# Patient Record
Sex: Female | Born: 1975 | Race: White | Hispanic: No | Marital: Married | Smoking: Former smoker
Health system: Southern US, Community
[De-identification: ages and names within clinical notes are randomized; demographics above are authoritative.]

## PROBLEM LIST (undated history)

## (undated) DIAGNOSIS — IMO0002 Reserved for concepts with insufficient information to code with codable children: Secondary | ICD-10-CM

## (undated) DIAGNOSIS — Z9889 Other specified postprocedural states: Secondary | ICD-10-CM

## (undated) DIAGNOSIS — Z9882 Breast implant status: Secondary | ICD-10-CM

## (undated) DIAGNOSIS — C539 Malignant neoplasm of cervix uteri, unspecified: Secondary | ICD-10-CM

## (undated) DIAGNOSIS — R87619 Unspecified abnormal cytological findings in specimens from cervix uteri: Secondary | ICD-10-CM

## (undated) HISTORY — PX: CERVICAL CONE BIOPSY: SUR198

## (undated) HISTORY — PX: ENDOMETRIAL ABLATION: SHX621

## (undated) HISTORY — DX: Reserved for concepts with insufficient information to code with codable children: IMO0002

## (undated) HISTORY — DX: Other specified postprocedural states: Z98.890

## (undated) HISTORY — DX: Unspecified abnormal cytological findings in specimens from cervix uteri: R87.619

## (undated) HISTORY — PX: AUGMENTATION MAMMAPLASTY: SUR837

## (undated) HISTORY — PX: CERVICAL BIOPSY  W/ LOOP ELECTRODE EXCISION: SUR135

## (undated) HISTORY — PX: TUBAL LIGATION: SHX77

---

## 2006-04-10 ENCOUNTER — Inpatient Hospital Stay: Payer: Self-pay | Admitting: Unknown Physician Specialty

## 2006-06-22 ENCOUNTER — Ambulatory Visit: Payer: Self-pay | Admitting: Orthopedic Surgery

## 2011-09-07 ENCOUNTER — Ambulatory Visit: Payer: Self-pay | Admitting: Gynecologic Oncology

## 2011-09-07 ENCOUNTER — Ambulatory Visit: Payer: Self-pay

## 2011-09-21 ENCOUNTER — Ambulatory Visit: Payer: Self-pay | Admitting: Gynecologic Oncology

## 2011-09-27 LAB — PATHOLOGY REPORT

## 2011-10-12 ENCOUNTER — Ambulatory Visit: Payer: Self-pay | Admitting: Gynecologic Oncology

## 2011-10-12 LAB — CBC
HCT: 38.1 % (ref 35.0–47.0)
HGB: 12.8 g/dL (ref 12.0–16.0)
MCH: 27.3 pg (ref 26.0–34.0)
MCHC: 33.6 g/dL (ref 32.0–36.0)
MCV: 81 fL (ref 80–100)
Platelet: 240 10*3/uL (ref 150–440)
RBC: 4.69 10*6/uL (ref 3.80–5.20)
RDW: 13.5 % (ref 11.5–14.5)
WBC: 9 10*3/uL (ref 3.6–11.0)

## 2011-10-12 LAB — BASIC METABOLIC PANEL
Anion Gap: 7 (ref 7–16)
BUN: 11 mg/dL (ref 7–18)
Calcium, Total: 8.7 mg/dL (ref 8.5–10.1)
Chloride: 107 mmol/L (ref 98–107)
Co2: 27 mmol/L (ref 21–32)
Creatinine: 0.78 mg/dL (ref 0.60–1.30)
EGFR (African American): >60
EGFR (Non-African Amer.): >60
Glucose: 100 mg/dL — ABNORMAL HIGH (ref 65–99)
Osmolality: 281 (ref 275–301)
Potassium: 3.9 mmol/L (ref 3.5–5.1)
Sodium: 141 mmol/L (ref 136–145)

## 2011-10-12 LAB — PREGNANCY, URINE: Pregnancy Test, Urine: NEGATIVE m[IU]/mL

## 2011-10-19 ENCOUNTER — Ambulatory Visit: Payer: Self-pay | Admitting: Gynecologic Oncology

## 2011-10-19 LAB — PREGNANCY, URINE: Pregnancy Test, Urine: NEGATIVE m[IU]/mL

## 2011-10-21 ENCOUNTER — Ambulatory Visit: Payer: Self-pay | Admitting: Gynecologic Oncology

## 2011-10-25 LAB — PATHOLOGY REPORT

## 2011-11-21 ENCOUNTER — Ambulatory Visit: Payer: Self-pay | Admitting: Gynecologic Oncology

## 2011-12-21 ENCOUNTER — Ambulatory Visit: Payer: Self-pay | Admitting: Gynecologic Oncology

## 2012-01-21 ENCOUNTER — Ambulatory Visit: Payer: Self-pay | Admitting: Gynecologic Oncology

## 2012-05-16 ENCOUNTER — Encounter: Payer: Self-pay | Admitting: Internal Medicine

## 2012-05-16 ENCOUNTER — Ambulatory Visit (INDEPENDENT_AMBULATORY_CARE_PROVIDER_SITE_OTHER): Payer: 59 | Admitting: Internal Medicine

## 2012-05-16 VITALS — BP 120/80 | HR 91 | Temp 98.1°F | Ht 62.5 in | Wt 142.0 lb

## 2012-05-16 DIAGNOSIS — R21 Rash and other nonspecific skin eruption: Secondary | ICD-10-CM

## 2012-05-28 ENCOUNTER — Encounter: Payer: Self-pay | Admitting: Internal Medicine

## 2012-05-28 NOTE — Progress Notes (Signed)
  Subjective:    Patient ID: Danielle Marshall, female    DOB: 26-Nov-1975, 37 y.o.   MRN: [id number]  HPI 37 year old female who presents as an acute visit with a rash.  She is planning a root canal in the near future.  Was placed on clindamycin.  This is her third week of clindamycin.  She noticed this am some itching - starting on her head.  Now has a diffuse rash.  Increased itching.  No other new meds or exposures.  No sore throat.  No tongue swelling.  Throat may feel a little irritated.  No trouble breathing.    Past Medical History  Diagnosis Date  . Abnormal Pap smear     s/p LEEP     Review of Systems Patient denies any headache, lightheadedness or dizziness.  No fever.  No significant chest tightness.  No increased shortness of breath or trouble breathing.  No nausea or vomiting.   Has an IUD.        Objective:   Physical Exam Filed Vitals:   05/16/12 0813  BP: 120/80  Pulse: 91  Temp: 98.1 F (81.44 C)   37 year old female in no acute distress.   HEENT:  Nares- clear.  Oropharynx - without lesions.  No tongue swelling.   NECK:  Supple.  Nontender.    HEART:  Appears to be regular. LUNGS:  No crackles or wheezing audible.  Respirations even and unlabored.  RADIAL PULSE:  Equal bilaterally.    SKIN:  Diffuse erythematous based rash that appears to be consistent with a drug reaction.            Assessment & Plan:  RASH.  Appears to be consistent with a drug reaction.  Stop clindamycin and notify her dentist.  Benadryl and Zyrtec as directed.  Medrol dose pack - 6 day taper.  Follow.

## 2012-06-21 ENCOUNTER — Ambulatory Visit: Payer: Self-pay

## 2012-06-21 ENCOUNTER — Ambulatory Visit: Payer: Self-pay | Admitting: Gynecologic Oncology

## 2012-07-18 ENCOUNTER — Encounter: Payer: Self-pay | Admitting: Internal Medicine

## 2012-07-20 ENCOUNTER — Ambulatory Visit: Payer: Self-pay | Admitting: Gynecologic Oncology

## 2012-07-30 ENCOUNTER — Emergency Department (HOSPITAL_COMMUNITY)
Admission: EM | Admit: 2012-07-30 | Discharge: 2012-07-31 | Disposition: A | Discharge status: Home / Self Care | Payer: 59 | Attending: Emergency Medicine | Admitting: Emergency Medicine

## 2012-07-30 DIAGNOSIS — R110 Nausea: Secondary | ICD-10-CM | POA: Insufficient documentation

## 2012-07-30 DIAGNOSIS — Z978 Presence of other specified devices: Secondary | ICD-10-CM | POA: Insufficient documentation

## 2012-07-30 DIAGNOSIS — S060X9A Concussion with loss of consciousness of unspecified duration, initial encounter: Secondary | ICD-10-CM | POA: Insufficient documentation

## 2012-07-30 DIAGNOSIS — R6889 Other general symptoms and signs: Secondary | ICD-10-CM

## 2012-07-30 DIAGNOSIS — Z87891 Personal history of nicotine dependence: Secondary | ICD-10-CM | POA: Insufficient documentation

## 2012-07-30 DIAGNOSIS — H539 Unspecified visual disturbance: Secondary | ICD-10-CM | POA: Insufficient documentation

## 2012-07-30 DIAGNOSIS — Z8541 Personal history of malignant neoplasm of cervix uteri: Secondary | ICD-10-CM | POA: Insufficient documentation

## 2012-07-30 HISTORY — DX: Breast implant status: Z98.82

## 2012-07-30 HISTORY — DX: Malignant neoplasm of cervix uteri, unspecified: C53.9

## 2012-07-31 ENCOUNTER — Emergency Department (HOSPITAL_COMMUNITY): Payer: 59

## 2012-07-31 ENCOUNTER — Encounter (HOSPITAL_COMMUNITY): Payer: Self-pay | Admitting: *Deleted

## 2012-07-31 MED ORDER — HYDROCODONE-ACETAMINOPHEN 5-325 MG PO TABS: 1.0000 | ORAL_TABLET | Freq: Four times a day (QID) | ORAL | Status: DC | PRN

## 2012-07-31 MED ORDER — ACETAMINOPHEN 325 MG PO TABS
650.0000 mg | ORAL_TABLET | Freq: Once | ORAL | Status: AC
  Administered 2012-07-31: 650 mg via ORAL
  Filled 2012-07-31: qty 2

## 2012-07-31 NOTE — ED Provider Notes (Signed)
Medical screening examination/treatment/procedure(s) were performed by non-physician practitioner and as supervising physician I was immediately available for consultation/collaboration.   Dione Booze, MD 07/31/12 [phone]
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
## 2012-08-16 LAB — PATHOLOGY REPORT

## 2012-08-20 ENCOUNTER — Ambulatory Visit: Payer: Self-pay | Admitting: Gynecologic Oncology

## 2012-09-19 ENCOUNTER — Ambulatory Visit: Payer: Self-pay | Admitting: Gynecologic Oncology

## 2012-11-01 ENCOUNTER — Ambulatory Visit: Payer: Self-pay | Admitting: Obstetrics and Gynecology

## 2012-11-01 LAB — BASIC METABOLIC PANEL
Anion Gap: 2 — ABNORMAL LOW (ref 7–16)
BUN: 9 mg/dL (ref 7–18)
Calcium, Total: 9 mg/dL (ref 8.5–10.1)
Chloride: 106 mmol/L (ref 98–107)
Co2: 30 mmol/L (ref 21–32)
Creatinine: 0.68 mg/dL (ref 0.60–1.30)
EGFR (African American): >60
EGFR (Non-African Amer.): >60
Glucose: 93 mg/dL (ref 65–99)
Osmolality: 274 (ref 275–301)
Potassium: 4.6 mmol/L (ref 3.5–5.1)
Sodium: 138 mmol/L (ref 136–145)

## 2012-11-01 LAB — PREGNANCY, URINE: Pregnancy Test, Urine: NEGATIVE m[IU]/mL

## 2012-11-01 LAB — CBC
HCT: 39 % (ref 35.0–47.0)
HGB: 13.3 g/dL (ref 12.0–16.0)
MCH: 27.2 pg (ref 26.0–34.0)
MCHC: 34.2 g/dL (ref 32.0–36.0)
MCV: 80 fL (ref 80–100)
Platelet: 267 10*3/uL (ref 150–440)
RBC: 4.9 10*6/uL (ref 3.80–5.20)
RDW: 13.7 % (ref 11.5–14.5)
WBC: 11.2 10*3/uL — ABNORMAL HIGH (ref 3.6–11.0)

## 2012-11-07 ENCOUNTER — Ambulatory Visit: Payer: Self-pay | Admitting: Obstetrics and Gynecology

## 2012-11-09 LAB — PATHOLOGY REPORT

## 2014-01-03 DIAGNOSIS — K581 Irritable bowel syndrome with constipation: Secondary | ICD-10-CM | POA: Insufficient documentation

## 2014-01-03 DIAGNOSIS — R1012 Left upper quadrant pain: Secondary | ICD-10-CM

## 2014-01-03 DIAGNOSIS — G8929 Other chronic pain: Secondary | ICD-10-CM | POA: Insufficient documentation

## 2014-01-16 ENCOUNTER — Ambulatory Visit: Payer: Self-pay | Admitting: Unknown Physician Specialty

## 2014-03-22 DIAGNOSIS — Z9889 Other specified postprocedural states: Secondary | ICD-10-CM

## 2014-03-22 HISTORY — DX: Other specified postprocedural states: Z98.890

## 2014-07-09 NOTE — Op Note (Signed)
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   PROCEDURE: LEEP.   SURGEON: Weber Cooks, MD    ANESTHESIA: General.   COMPLICATIONS: None.  ESTIMATED BLOOD LOSS: Minimal.   INDICATION FOR SURGERY: Ms. Bensman is a 39 year old patient who presented with high-grade SIL on Pap smear and on colposcopically directed biopsy and was noted to have moderate dysplasia with a negative ECC. Therefore, decision was made to proceed with an LEEP.   FINDINGS AT TIME OF SURGERY: Normal external genitalia, urethral meatus, urethra, bladder, and vagina. Cervix with a small central iodine-negative area. IUD thread in-situ. No vaginal lesions seen. Remainder of the pelvic exam was unremarkable.   OPERATIVE REPORT: After adequate general anesthesia had been obtained, the patient was prepped and draped in high lithotomy position. Lugol stain was done. The procedure was more difficult due to the fact that the IUD thread had to be preserved. The cervix was resected in four portions. Hemostasis was noted to be adequate at the end of the procedure. Monsel solution was applied. The patient tolerated the procedure well and was taken to the recovery room in satisfactory condition. Postoperative urine was clear. Pad, sponge, needle, and instrument counts were correct x2.   ____________________________ Weber Cooks, MD bem:drc D: 10/19/2011 14:52:58 ET T: 10/19/2011 15:37:12 ET JOB#: [id number] [name] MD ELECTRONICALLY SIGNED 10/26/2011 9:10

## 2014-07-12 NOTE — Op Note (Signed)
PATIENT NAME:  Danielle Marshall, Danielle Marshall MR#:  [id number] DATE OF BIRTH:  1975-12-26  DATE OF PROCEDURE:  11/07/2012  PREOPERATIVE DIAGNOSES: Cervical intraepithelial neoplasia-3;  multiparous female desiring permanent sterilization; menorrhagia, unresponsive to medical management.   POSTOPERATIVE DIAGNOSES: Cervical intraepithelial neoplasia-3;  multiparous female desiring permanent sterilization; menorrhagia, unresponsive to medical management.   PROCEDURE PERFORMED: Laparoscopic Falope-Ring bilateral tubal ligation; hysteroscopy;   dilatation and curettage; NovaSure endometrial ablation; and cold knife conization with  placement of sutures.   SURGEON: Delsa Sale, M.D.   ESTIMATED BLOOD LOSS: 250 mL.   FINDINGS: Bilateral butt sign after Falope-Ring applications. Good cauterization of the lining of the uterus from endometrial ablation, and Lugol's white mass on the cervix where the patient had abnormal biopsies with bleeding that was stopped by cervical artery sutures, cautery and Sturmdorf-type sutures.   DESCRIPTION OF PROCEDURE: The patient was taken to the operating room and placed in supine position. After adequate general endotracheal anesthesia was instilled, the patient was prepped and draped in the usual sterile fashion. The umbilicus was injected with Marcaine. The side-opening speculum was placed in the patient's vagina. The anterior lip of the cervix was grasped with a single-tooth tenaculum and the Hulka tenaculum was placed. The patient was placed in Trendelenburg.   Incision was made at the umbilicus. This was carried sharply down to the fascia. Veress needle was placed. Hang drop test, fluid inflation, fluid aspiration test showed proper placement of the Veress needle. CO2 was placed on low flow. When tympany was heard, CO2 was placed on high flow. The trocars placed in the umbilicus. The camera was placed under direct visualization, and 8 mm trocar port was placed approximately 2 cm  above the pubic symphysis under direct visualization. The tubes were doused in Marcaine, and the Falope-Ring applicator was placed into the abdomen. First the right tube was grasped and the Falope-Ring applicator was activated, then the applicator was reloaded, and the applicator was used to grasp the tube and place the Falope-Ring. Good blanching was seen bilaterally. Good butt sign was seen bilaterally. Photographs were taken. The trocars were removed. The umbilicus was sewn with the deep UR-6 suture and the skin was closed with 4-0 Monocryl and Dermabond. Band-Aids were placed.   Attention was then turned to the vagina where the Hulka tenaculum was removed from the cervix. A side-opening speculum was placed back into the vagina and the anterior lip of the cervix was grasped with a single-tooth tenaculum. The uterus was sounded. It sounded at 7 cm. The cervix sounded at 3. The fan width was 3.1. The cautery was performed with the NovaSure ablation, and the ablation occurred for 2 minutes.   Attention was then turned to the tenaculum which was removed. Good hemostasis was identified. The hysteroscope showed proper placement and proper burning. The tenaculum was removed from the patient's cervix and 2 sutures were placed, one at 3 and one at 9, around the cervical arteries. These were used also for manipulation. Lugol's was placed on the cervix. The aforementioned findings were seen. Cold knife was then used to cut a cone which was then excised with the Metzenbaum scissors. The patient was found to be hemostatic after 2 sutures at the base of the cone and placement of Monsel's .  The patient was then laid supine and taken to recovery after having tolerated the procedure well.    ____________________________ Delsa Sale, MD cck:np D: 11/09/2012 22:35:00 ET T: 11/09/2012 23:16:49 ET JOB#: [id number]  cc: Morey Hummingbird  Clayburn Pert, MD, <Dictator> Delsa Sale MD ELECTRONICALLY SIGNED 11/16/2012 18:47

## 2015-05-28 LAB — HM PAP SMEAR: HM Pap smear: NEGATIVE

## 2017-01-28 ENCOUNTER — Ambulatory Visit (INDEPENDENT_AMBULATORY_CARE_PROVIDER_SITE_OTHER): Payer: 59 | Admitting: Obstetrics and Gynecology

## 2017-01-28 ENCOUNTER — Encounter: Payer: Self-pay | Admitting: Obstetrics and Gynecology

## 2017-01-28 VITALS — BP 118/74 | Ht 62.0 in | Wt 130.0 lb

## 2017-01-28 DIAGNOSIS — Z01419 Encounter for gynecological examination (general) (routine) without abnormal findings: Secondary | ICD-10-CM | POA: Diagnosis not present

## 2017-01-28 DIAGNOSIS — Z124 Encounter for screening for malignant neoplasm of cervix: Secondary | ICD-10-CM

## 2017-01-28 DIAGNOSIS — Z1339 Encounter for screening examination for other mental health and behavioral disorders: Secondary | ICD-10-CM | POA: Diagnosis not present

## 2017-01-28 NOTE — Progress Notes (Signed)
Gynecology Annual Exam  PCP: Danielle Pheasant, MD  Chief Complaint  Patient presents with  . Annual Exam    History of Present Illness:  [name] is a 41 y.o. [id number] who is amenorrheic due to endometrial ablation.   She is single partner, contraception - tubal ligation.  Last Pap: 05/2015  Results were: no abnormalities /neg HPV DNA negative (Significant history of abnormal pap smears and is status post LEEP and CKC). Hx of STDs: HPV  Last mammogram: n/a  There is no FH of breast cancer. There is no FH of ovarian cancer. The patient does not do self-breast exams.  Tobacco use: former smoker. Alcohol use: social drinker Exercise: moderately active  The patient wears seatbelts: yes.      Cervical Dysplasia History: 7/13: CIN 2 on colposcopy -> LEEP --> CIN 3 w/ positive margins 06/2012 - Pap --> HGSIL 10/2012 - CKC --> CIN 3, + endocervical margin 2/15 - Pap --> NIL, HPV NEGATIVE, ECC NEG 6/15 - pap --> NIL (no endocervical component), HPV NEGATIVE 2/16 - pap --> NIL, HPV NEGATIVE 9/16 - pap --> ASCUS, HPV negative, ECC negative 05/2015 - pap --> NILM, HPV neg, ECC neg      Past Medical History:  Diagnosis Date  . Abnormal Pap smear    s/p LEEP   . Cervical cancer ([place])   . S/P breast augmentation     Past Surgical History:  Procedure Laterality Date  . AUGMENTATION MAMMAPLASTY    . CERVICAL BIOPSY  W/ LOOP ELECTRODE EXCISION    . CERVICAL CONE BIOPSY    . ENDOMETRIAL ABLATION    . TUBAL LIGATION      Prior to Admission medications   Medication Sig Start Date End Date Taking? Authorizing Provider  aspirin-acetaminophen-caffeine (EXCEDRIN MIGRAINE) [phone] MG per tablet Take 1 tablet by mouth every 6 (six) hours as needed for pain (migrains).   Yes [provider]  buPROPion (WELLBUTRIN) 100 MG tablet Take 100 mg 2 (two) times daily by mouth.   Yes [provider]    Allergies  Allergen Reactions  . Sulfa Antibiotics   .  Clindamycin/Lincomycin Rash    Gynecologic History: No LMP recorded. Patient is not currently having periods (Reason: IUD).  Obstetric History: [id number]  Social History   Socioeconomic History  . Marital status: Married    Spouse name: Not on file  . Number of children: Not on file  . Years of education: Not on file  . Highest education level: Not on file  Social Needs  . Financial resource strain: Not on file  . Food insecurity - worry: Not on file  . Food insecurity - inability: Not on file  . Transportation needs - medical: Not on file  . Transportation needs - non-medical: Not on file  Occupational History  . Not on file  Tobacco Use  . Smoking status: Former Research scientist (life sciences)  . Smokeless tobacco: Never Used  Substance and Sexual Activity  . Alcohol use: No  . Drug use: No  . Sexual activity: Yes    Birth control/protection: Surgical  Other Topics Concern  . Not on file  Social History Narrative  . Not on file    Family History  Problem Relation Age of Onset  . Hypertension Mother   . Prostate cancer Father   . Stroke Father     Review of Systems  Constitutional: Negative.   HENT: Negative.   Eyes: Negative.   Respiratory: Negative.   Cardiovascular: Negative.  Gastrointestinal: Negative.   Genitourinary: Negative.   Musculoskeletal: Negative.   Skin: Negative.   Neurological: Negative.   Psychiatric/Behavioral: Negative.      Physical Exam BP 118/74   Ht 5\' 2"  (1.575 m)   Wt 130 lb (59 kg)   BMI 23.78 kg/m    Physical Exam  Constitutional: She is oriented to person, place, and time. She appears well-developed and well-nourished. No distress.  Genitourinary: Uterus normal. Pelvic exam was performed with patient supine. There is no rash, tenderness, lesion or injury on the right labia. There is no rash, tenderness, lesion or injury on the left labia. No erythema, tenderness or bleeding in the vagina. No signs of injury around the vagina. No vaginal  discharge found. Right adnexum does not display mass, does not display tenderness and does not display fullness. Left adnexum does not display mass, does not display tenderness and does not display fullness. Cervix does not exhibit motion tenderness, lesion, discharge or polyp.   Uterus is mobile and anteverted. Uterus is not enlarged, tender or exhibiting a mass.  HENT:  Head: Normocephalic and atraumatic.  Eyes: EOM are normal. No scleral icterus.  Neck: Normal range of motion. Neck supple. No thyromegaly present.  Cardiovascular: Normal rate and regular rhythm. Exam reveals no gallop and no friction rub.  No murmur heard. Pulmonary/Chest: Effort normal and breath sounds normal. No respiratory distress. She has no wheezes. She has no rales. Right breast exhibits no inverted nipple, no mass, no nipple discharge, no skin change and no tenderness. Left breast exhibits no inverted nipple, no mass, no nipple discharge, no skin change and no tenderness.  Abdominal: Soft. Bowel sounds are normal. She exhibits no distension and no mass. There is no tenderness. There is no rebound and no guarding.  Musculoskeletal: Normal range of motion. She exhibits no edema or tenderness.  Lymphadenopathy:    She has no cervical adenopathy.       Right: No inguinal adenopathy present.       Left: No inguinal adenopathy present.  Neurological: She is alert and oriented to person, place, and time. No cranial nerve deficit.  Skin: Skin is warm and dry. No rash noted. No erythema.  Psychiatric: She has a normal mood and affect. Her behavior is normal. Judgment normal.    Female chaperone present for pelvic and breast  portions of the physical exam  Results: AUDIT Questionnaire (screen for alcoholism): 1 PHQ-9: 3   Assessment: 41 y.o. [id number] female here for routine annual gynecologic examination.  Plan: Problem List Items Addressed This Visit    None    Visit Diagnoses    Women's annual routine  gynecological examination    -  Primary   Screening for alcohol problem       Pap smear for cervical cancer screening       Relevant Orders   IGP, Aptima HPV, rfx 16/18,45     Screening: -- Blood pressure screen normal -- Colonoscopy - not due -- Mammogram - not due -- Weight screening: normal -- Depression screening negative (PHQ-9) -- Nutrition: normal -- cholesterol screening: per PCP -- osteoporosis screening: not due -- tobacco screening: not using -- alcohol screening: AUDIT questionnaire indicates low-risk usage. -- family history of breast cancer screening: done. not at high risk. -- no evidence of domestic violence or intimate partner violence. -- STD screening: gonorrhea/chlamydia NAAT not collected per patient request. -- pap smear collected per ASCCP guidelines -- flu vaccine received at work -- HPV vaccination series:  not Lanetta Inch, MD 01/28/2017 2:36 PM

## 2017-02-03 LAB — IGP, APTIMA HPV, RFX 16/18,45
HPV Aptima: NEGATIVE
PAP Smear Comment: 0

## 2017-02-11 ENCOUNTER — Encounter: Payer: Self-pay | Admitting: Obstetrics and Gynecology

## 2017-04-15 ENCOUNTER — Other Ambulatory Visit: Payer: Self-pay

## 2017-04-18 ENCOUNTER — Ambulatory Visit: Payer: Self-pay | Admitting: Urology

## 2017-05-09 ENCOUNTER — Ambulatory Visit: Payer: 59 | Admitting: Urology

## 2017-05-09 ENCOUNTER — Encounter: Payer: Self-pay | Admitting: Urology

## 2017-05-09 VITALS — BP 124/71 | HR 80 | Ht 62.5 in | Wt 132.1 lb

## 2017-05-09 DIAGNOSIS — R3129 Other microscopic hematuria: Secondary | ICD-10-CM | POA: Diagnosis not present

## 2017-05-09 LAB — URINALYSIS, COMPLETE
Bilirubin, UA: NEGATIVE
Glucose, UA: NEGATIVE
Ketones, UA: NEGATIVE
Leukocytes, UA: NEGATIVE
Nitrite, UA: NEGATIVE
Protein, UA: NEGATIVE
Specific Gravity, UA: >1.03 — ABNORMAL HIGH (ref 1.005–1.030)
Urobilinogen, Ur: 0.2 mg/dL (ref 0.2–1.0)
pH, UA: 5.5 (ref 5.0–7.5)

## 2017-05-09 LAB — MICROSCOPIC EXAMINATION
RBC, UA: NONE SEEN /hpf (ref 0–?)
WBC, UA: NONE SEEN /hpf (ref 0–?)

## 2017-05-09 NOTE — Progress Notes (Signed)
05/09/2017 2:41 PM   Antony Madura 10/12/1975 [id number]  Referring provider: Einar Pheasant, [address] [place], [place] [address]  Chief Complaint  Patient presents with  . Hematuria    HPI: I was consulted to assess the patient's 1 year history of intermittent gross hematuria microscopic hematuria.  The patient does not take daily aspirin or blood thinners.  She has a smoking history.  She sometimes gets some vague abdominal complaints but basically is otherwise asymptomatic  She only voids twice per day and has no nocturia and is continent.  She has had a hysterectomy.  Modifying factors: There are no other modifying factors  Associated signs and symptoms: There are no other associated signs and symptoms Aggravating and relieving factors: There are no other aggravating or relieving factors Severity: Moderate Duration: Persistent   PMH: Past Medical History:  Diagnosis Date  . Abnormal Pap smear    s/p LEEP   . Cervical cancer ([place])   . H/O abdominoplasty 2016  . S/P breast augmentation     Surgical History: Past Surgical History:  Procedure Laterality Date  . AUGMENTATION MAMMAPLASTY    . CERVICAL BIOPSY  W/ LOOP ELECTRODE EXCISION    . CERVICAL CONE BIOPSY    . ENDOMETRIAL ABLATION    . TUBAL LIGATION      Home Medications:  Allergies as of 05/09/2017      Reactions   Sulfa Antibiotics    Clindamycin/lincomycin Rash      Medication List        Accurate as of 05/09/17  2:41 PM. Always use your most recent med list.          aspirin-acetaminophen-caffeine 250-250-65 MG tablet Commonly known as:  EXCEDRIN MIGRAINE Take 1 tablet by mouth every 6 (six) hours as needed for pain (migrains).   buPROPion 100 MG tablet Commonly known as:  WELLBUTRIN Take 100 mg by mouth daily.   promethazine 12.5 MG tablet Commonly known as:  PHENERGAN TAKE 1 TABLET (12.5 MG TOTAL) BY MOUTH EVERY 8 (EIGHT) HOURS AS NEEDED FOR NAUSEA  OR VOMITING       Allergies:  Allergies  Allergen Reactions  . Sulfa Antibiotics   . Clindamycin/Lincomycin Rash    Family History: Family History  Problem Relation Age of Onset  . Hypertension Mother   . Heart disease Mother   . Prostate cancer Father   . Stroke Father     Social History:  reports that she has quit smoking. she has never used smokeless tobacco. She reports that she does not drink alcohol or use drugs.  ROS: UROLOGY Frequent Urination?: No Hard to postpone urination?: No Burning/pain with urination?: No Get up at night to urinate?: No Leakage of urine?: No Urine stream starts and stops?: No Trouble starting stream?: No Do you have to strain to urinate?: No Blood in urine?: Yes Urinary tract infection?: No Sexually transmitted disease?: No Injury to kidneys or bladder?: No Painful intercourse?: No Weak stream?: No Currently pregnant?: No Vaginal bleeding?: No Last menstrual period?: N/A  Gastrointestinal Nausea?: Yes Vomiting?: No Indigestion/heartburn?: No Diarrhea?: Yes Constipation?: Yes  Constitutional Fever: No Night sweats?: Yes Weight loss?: No Fatigue?: Yes  Skin Skin rash/lesions?: No Itching?: No  Eyes Blurred vision?: No Double vision?: No  Ears/Nose/Throat Sore throat?: No Sinus problems?: No  Hematologic/Lymphatic Swollen glands?: No Easy bruising?: Yes  Cardiovascular Leg swelling?: No Chest pain?: No  Respiratory Cough?: No Shortness of breath?: No  Endocrine Excessive thirst?: No  Musculoskeletal Back pain?: Yes Joint pain?: No  Neurological Headaches?: Yes Dizziness?: Yes  Psychologic Depression?: No Anxiety?: Yes  Physical Exam: BP 124/71 (BP Location: Right Arm, Patient Position: Sitting, Cuff Size: Normal)   Pulse 80   Ht 5' 2.5" (1.588 m)   Wt 132 lb 1.6 oz (59.9 kg)   SpO2 99%   BMI 23.78 kg/m   Constitutional:  Alert and oriented, No acute distress. HEENT: [place] AT, moist mucus  membranes.  Trachea midline, no masses. Cardiovascular: No clubbing, cyanosis, or edema. Respiratory: Normal respiratory effort, no increased work of breathing. GI: Abdomen is soft, nontender, nondistended, no abdominal masses GU: No CVA tenderness.  No abdominal tenderness Skin: No rashes, bruises or suspicious lesions. Lymph: No cervical or inguinal adenopathy. Neurologic: Grossly intact, no focal deficits, moving all 4 extremities. Psychiatric: Normal mood and affect.  Laboratory Data: Lab Results  Component Value Date   WBC 11.2 (H) 11/01/2012   HGB 13.3 11/01/2012   HCT 39.0 11/01/2012   MCV 80 11/01/2012   PLT 267 11/01/2012    Lab Results  Component Value Date   CREATININE 0.68 11/01/2012    No results found for: PSA  No results found for: TESTOSTERONE  No results found for: HGBA1C  Urinalysis No results found for: COLORURINE, APPEARANCEUR, LABSPEC, PHURINE, GLUCOSEU, HGBUR, BILIRUBINUR, KETONESUR, PROTEINUR, UROBILINOGEN, NITRITE, LEUKOCYTESUR  Pertinent Imaging: None  Assessment & Plan: The patient's urine was normal today by sent it for culture.  She will have a CT scan and come back for cystoscopy.  We will proceed accordingly.  1. Microscopic hematuria 2.  Gross hematuria - Urinalysis, Complete   No Follow-up on file.  Reece Packer, MD  [facility] [address] [place], [place] [address] [phone]

## 2017-05-10 LAB — BUN+CREAT
BUN/Creatinine Ratio: 11 (ref 9–23)
BUN: 9 mg/dL (ref 6–20)
Creatinine, Ser: 0.79 mg/dL (ref 0.57–1.00)
GFR calc Af Amer: 109 mL/min/{1.73_m2} (ref >59)
GFR calc non Af Amer: 95 mL/min/{1.73_m2} (ref >59)

## 2017-05-11 ENCOUNTER — Encounter: Payer: Self-pay | Admitting: Obstetrics and Gynecology

## 2017-05-11 ENCOUNTER — Ambulatory Visit (INDEPENDENT_AMBULATORY_CARE_PROVIDER_SITE_OTHER): Payer: 59 | Admitting: Obstetrics and Gynecology

## 2017-05-11 VITALS — BP 118/70 | Wt 133.0 lb

## 2017-05-11 DIAGNOSIS — N939 Abnormal uterine and vaginal bleeding, unspecified: Secondary | ICD-10-CM

## 2017-05-11 NOTE — Progress Notes (Signed)
Obstetrics & Gynecology Office Visit   Chief Complaint  Patient presents with  . Menstrual Problem    History of Present Illness: 42 y.o. [id number] female who has a history of endometrial ablation and has been amenorrheic since this ablation nearly five years ago.  She also has a history of hematuria, which is now being evaluated by a urologist. She states she recently saw a urologist who is planning a CT scan of her abdomen/pelvis.  She presents today because she got up this morning and after getting her kids ready for school, went to the bathroom and when she wiped she noted blood on the tissue. She then noted that she had soaked her sweat pants with blood. She had only been wearing these pants for about 20 minutes. She did not feel like she was leaking blood at any point.  She is concerned something abnormal is happening. She denies cramping. She has been wearing a pad, but states no new blood is on the pad.    Past Medical History:  Diagnosis Date  . Abnormal Pap smear    s/p LEEP   . Cervical cancer ([place])   . H/O abdominoplasty 2016  . S/P breast augmentation     Past Surgical History:  Procedure Laterality Date  . AUGMENTATION MAMMAPLASTY    . CERVICAL BIOPSY  W/ LOOP ELECTRODE EXCISION    . CERVICAL CONE BIOPSY    . ENDOMETRIAL ABLATION    . TUBAL LIGATION      Gynecologic History: No LMP recorded. Patient has had an ablation.  Obstetric History: [id number]  Family History  Problem Relation Age of Onset  . Hypertension Mother   . Heart disease Mother   . Prostate cancer Father   . Stroke Father     Social History   Socioeconomic History  . Marital status: Married    Spouse name: Not on file  . Number of children: Not on file  . Years of education: Not on file  . Highest education level: Not on file  Social Needs  . Financial resource strain: Not on file  . Food insecurity - worry: Not on file  . Food insecurity - inability: Not on file  . Transportation needs -  medical: Not on file  . Transportation needs - non-medical: Not on file  Occupational History  . Not on file  Tobacco Use  . Smoking status: Former Research scientist (life sciences)  . Smokeless tobacco: Never Used  Substance and Sexual Activity  . Alcohol use: No  . Drug use: No  . Sexual activity: Yes    Birth control/protection: Surgical  Other Topics Concern  . Not on file  Social History Narrative  . Not on file    Allergies  Allergen Reactions  . Sulfa Antibiotics   . Clindamycin/Lincomycin Rash    Medications   Medication Sig Start Date End Date Taking? Authorizing Provider  buPROPion (WELLBUTRIN) 100 MG tablet Take 100 mg by mouth daily.    Yes [provider]  aspirin-acetaminophen-caffeine (EXCEDRIN MIGRAINE) [phone] MG per tablet Take 1 tablet by mouth every 6 (six) hours as needed for pain (migrains).    [provider]  promethazine (PHENERGAN) 12.5 MG tablet TAKE 1 TABLET (12.5 MG TOTAL) BY MOUTH EVERY 8 (EIGHT) HOURS AS NEEDED FOR NAUSEA OR VOMITING 04/12/17   [provider]    Review of Systems  Constitutional: Negative.   HENT: Negative.   Eyes: Negative.   Respiratory: Negative.   Cardiovascular: Negative.   Gastrointestinal: Negative.  Genitourinary: Negative.        Except as noted in HPI  Musculoskeletal: Negative.   Skin: Negative.   Neurological: Negative.   Psychiatric/Behavioral: Negative.      Physical Exam BP 118/70   Wt 133 lb (60.3 kg)   BMI 23.94 kg/m  No LMP recorded. Patient has had an ablation. Physical Exam  Constitutional: She is oriented to person, place, and time. She appears well-developed and well-nourished. No distress.  HENT:  Head: Normocephalic and atraumatic.  Eyes: Conjunctivae are normal. No scleral icterus.  Pulmonary/Chest: Effort normal.  Neurological: She is oriented to person, place, and time. No cranial nerve deficit.  Psychiatric: She has a normal mood and affect. Her behavior is normal. Judgment  normal.    Assessment: 42 y.o. [id number] female here for  1. Abnormal uterine bleeding      Plan: Problem List Items Addressed This Visit    None    Visit Diagnoses    Abnormal uterine bleeding    -  Primary     Bleeding source unclear.  Likely source is uterine given that she did not feel like she leaked urine and she normally does not have frank blood in her urine.  This is still a possibility.  We discussed that some bleeding could occur after an endometrial ablation.  I believe this is the likely source of her bleeding. I offered her an exam to be sure. However, she has had a recent normal exam.  If her bleeding continues, I instructed her to return to clinic where we would obtain an ultrasound and perform a pelvic exam.  If her bleeding continues intermittently, it will depend on how bothersome her bleeding is as to whether she would want an intervention. I encouraged her to complete her workup with her urologist for hematuria.    15 minutes spent in face to face discussion with > 50% spent in counseling,management, and coordination of care of her abnormal uterine bleeding.   Prentice Docker, MD 05/11/2017 12:45 PM

## 2017-05-12 LAB — CULTURE, URINE COMPREHENSIVE

## 2017-05-30 ENCOUNTER — Other Ambulatory Visit: Payer: 59

## 2017-05-30 ENCOUNTER — Other Ambulatory Visit: Payer: 59 | Admitting: Urology

## 2017-06-08 ENCOUNTER — Telehealth: Payer: Self-pay | Admitting: Urology

## 2017-06-08 NOTE — Telephone Encounter (Signed)
Pt states there is no way possible for her to afford a CT at this time, so she canceled cysto appt.  Just F.Y.I.

## 2017-06-13 ENCOUNTER — Other Ambulatory Visit: Payer: 59

## 2017-12-07 ENCOUNTER — Other Ambulatory Visit: Payer: Self-pay | Admitting: Physician Assistant

## 2017-12-07 DIAGNOSIS — Z1231 Encounter for screening mammogram for malignant neoplasm of breast: Secondary | ICD-10-CM

## 2018-01-04 ENCOUNTER — Ambulatory Visit
Admission: RE | Admit: 2018-01-04 | Discharge: 2018-01-04 | Disposition: A | Discharge status: Home / Self Care | Payer: 59 | Source: Ambulatory Visit | Attending: Physician Assistant | Admitting: Physician Assistant

## 2018-01-04 ENCOUNTER — Other Ambulatory Visit: Payer: Self-pay | Admitting: Physician Assistant

## 2018-01-04 DIAGNOSIS — Z1231 Encounter for screening mammogram for malignant neoplasm of breast: Secondary | ICD-10-CM

## 2018-11-15 ENCOUNTER — Encounter: Payer: Self-pay | Admitting: Obstetrics and Gynecology

## 2018-11-15 ENCOUNTER — Ambulatory Visit (INDEPENDENT_AMBULATORY_CARE_PROVIDER_SITE_OTHER): Payer: Managed Care, Other (non HMO) | Admitting: Obstetrics and Gynecology

## 2018-11-15 ENCOUNTER — Other Ambulatory Visit: Payer: Self-pay

## 2018-11-15 VITALS — BP 116/70 | Ht 62.5 in | Wt 130.0 lb

## 2018-11-15 DIAGNOSIS — Z01419 Encounter for gynecological examination (general) (routine) without abnormal findings: Secondary | ICD-10-CM

## 2018-11-15 DIAGNOSIS — Z1339 Encounter for screening examination for other mental health and behavioral disorders: Secondary | ICD-10-CM

## 2018-11-15 DIAGNOSIS — Z1331 Encounter for screening for depression: Secondary | ICD-10-CM

## 2018-11-15 NOTE — Progress Notes (Signed)
Gynecology Annual Exam  PCP: Marinda Elk, MD  Chief Complaint  Patient presents with  . Gynecologic Exam   History of Present Illness:  [name] is a 43 y.o. [id number] who LMP was Patient's last menstrual period was 11/12/2018 (exact date)., presents today for her annual examination.  Her menses are regular every 28-30 days, lasting 2-3  day(s).  Dysmenorrhea moderate, occurring premenstrually. She does not have intermenstrual bleeding.  She is sexually active.  Contraception: tubal ligation.  Last Pap: 2 years  Results were: no abnormalities /neg HPV DNA negative Hx of STDs: HPV  Last mammogram:  Less than 1 year - negative There is no FH of breast cancer. There is no FH of ovarian cancer. The patient does not do self-breast exams.  Tobacco use: former smoker. Alcohol use: social drinker Exercise: moderately active  The patient wears seatbelts: yes.      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 Abnormal Pap smear    s/p LEEP   . H/O abdominoplasty 2016  . S/P breast augmentation     Past Surgical History:  Procedure Laterality Date  . AUGMENTATION MAMMAPLASTY    . CERVICAL BIOPSY  W/ LOOP ELECTRODE EXCISION    . CERVICAL CONE BIOPSY    . ENDOMETRIAL ABLATION    . TUBAL LIGATION      Prior to Admission medications   Medication Sig Start Date End Date Taking? Authorizing Provider  aspirin-acetaminophen-caffeine (EXCEDRIN MIGRAINE) [phone] MG per tablet Take 1 tablet by mouth every 6 (six) hours as needed for pain  (migrains).   Yes [provider]  busPIRone (BUSPAR) 5 MG tablet Take by mouth. 12/07/17 12/07/18 Yes [provider]  phentermine (ADIPEX-P) 37.5 MG tablet  11/09/18  Yes [provider]  promethazine (PHENERGAN) 12.5 MG tablet TAKE 1 TABLET (12.5 MG TOTAL) BY MOUTH EVERY 8 (EIGHT) HOURS AS NEEDED FOR NAUSEA OR VOMITING 04/12/17  Yes [provider]  SUMAtriptan (IMITREX) 50 MG tablet TAKE ONE TAB AT ONSET OF HEADACHE. MAY TAKE A SECOND DOSE AFTER 2 HOURS IF NEEDED. MAX 2 TABS/DAY 07/31/18  Yes [provider]    Allergies  Allergen Reactions  . Sulfa Antibiotics   . Clindamycin/Lincomycin Rash   Gynecologic History: Patient's last menstrual period was 11/12/2018 (exact date).  Obstetric History: [id number]  Social History   Socioeconomic History  . Marital status: Married    Spouse name: Not on file  . Number of children: Not on file  . Years of education: Not on file  . Highest education level: Not on file  Occupational History  . Not on file  Social Needs  . Financial resource strain: Not on file  . Food insecurity    Worry: Not on file    Inability: Not on file  . Transportation needs    Medical: Not on file    Non-medical: Not on file  Tobacco Use  . Smoking status: Former Research scientist (life sciences)  . Smokeless tobacco: Never Used  Substance and Sexual  Activity  . Alcohol use: No  . Drug use: No  . Sexual activity: Yes    Birth control/protection: Surgical    Comment: Tubal ligation   Lifestyle  . Physical activity    Days per week: Not on file    Minutes per session: Not on file  . Stress: Not on file  Relationships  . Social Herbalist on phone: Not on file    Gets together: Not on file    Attends religious service: Not on file    Active member of club or organization: Not on file    Attends meetings of clubs or organizations: Not on file    Relationship status: Not on file  . Intimate partner violence    Fear of current or  ex partner: Not on file    Emotionally abused: Not on file    Physically abused: Not on file    Forced sexual activity: Not on file  Other Topics Concern  . Not on file  Social History Narrative  . Not on file   Family History  Problem Relation Age of Onset  . Hypertension Mother   . Heart disease Mother   . Prostate cancer Father   . Stroke Father   . Breast cancer Paternal Grandmother 28  . Breast cancer Other        mat gr aunt x1  Pat gr aunt x2    Review of Systems  Constitutional: Negative.   HENT: Negative.   Eyes: Negative.   Respiratory: Negative.   Cardiovascular: Negative.   Gastrointestinal: Negative.   Genitourinary: Negative.   Musculoskeletal: Negative.   Skin: Negative.   Neurological: Negative.   Psychiatric/Behavioral: Negative.      Physical Exam BP 116/70   Ht 5' 2.5" (1.588 m)   Wt 130 lb (59 kg)   LMP 11/12/2018 (Exact Date)   BMI 23.40 kg/m    Physical Exam Constitutional:      General: She is not in acute distress.    Appearance: Normal appearance. She is well-developed.  Genitourinary:     Pelvic exam was performed with patient supine.     Vulva, urethra, bladder and uterus normal.     No inguinal adenopathy present in the right or left side.    No signs of injury in the vagina.     No vaginal discharge, erythema, tenderness or bleeding.     No cervical motion tenderness, discharge, lesion or polyp.     Uterus is mobile.     Uterus is not enlarged or tender.     No uterine mass detected.    Uterus is anteverted.     No right or left adnexal mass present.     Right adnexa not tender or full.     Left adnexa not tender or full.  HENT:     Head: Normocephalic and atraumatic.  Eyes:     General: No scleral icterus.    Conjunctiva/sclera: Conjunctivae normal.  Neck:     Musculoskeletal: Normal range of motion and neck supple.     Thyroid: No thyromegaly.  Cardiovascular:     Rate and Rhythm: Normal rate and regular rhythm.      Heart sounds: No murmur. No friction rub. No gallop.   Pulmonary:     Effort: Pulmonary effort is normal. No respiratory distress.     Breath sounds: Normal breath sounds. No wheezing or rales.  Chest:     Breasts:  Right: No inverted nipple, mass, nipple discharge, skin change or tenderness.        Left: No inverted nipple, mass, nipple discharge, skin change or tenderness.  Abdominal:     General: Bowel sounds are normal. There is no distension.     Palpations: Abdomen is soft. There is no mass.     Tenderness: There is no abdominal tenderness. There is no guarding or rebound.  Musculoskeletal: Normal range of motion.        General: No swelling or tenderness.  Lymphadenopathy:     Cervical: No cervical adenopathy.     Lower Body: No right inguinal adenopathy. No left inguinal adenopathy.  Neurological:     General: No focal deficit present.     Mental Status: She is alert and oriented to person, place, and time.     Cranial Nerves: No cranial nerve deficit.  Skin:    General: Skin is warm and dry.     Findings: No erythema or rash.  Psychiatric:        Mood and Affect: Mood normal.        Behavior: Behavior normal.        Judgment: Judgment normal.     Female chaperone present for pelvic and breast  portions of the physical exam   Assessment: 43 y.o. [id number] female here for routine annual gynecologic examination.  Plan: Problem List Items Addressed This Visit    None    Visit Diagnoses    Women's annual routine gynecological examination    -  Primary   Screening for depression       Screening for alcoholism          Screening: -- Blood pressure screen normal -- Colonoscopy - not due -- Mammogram - not due -- Weight screening: normal -- Depression screening negative (PHQ-9) -- Nutrition: normal -- cholesterol screening: not due for screening -- osteoporosis screening: not due -- tobacco screening: not using -- alcohol screening: AUDIT questionnaire  indicates low-risk usage. -- family history of breast cancer screening: done. not at high risk. -- no evidence of domestic violence or intimate partner violence. -- STD screening: gonorrhea/chlamydia NAAT not collected per patient request. -- pap smear not collected per ASCCP guidelines -- HPV vaccination series: not eligilbe  Will get pap smear next year.  If her menses get worse, will consider further treatment with possible surgery versus hormonal therapy. However, we did discuss that she has a history of ablation and hormonal therapy will be much more difficult due to this.   Prentice Docker, MD 11/15/2018 6:27 PM

## 2019-12-03 ENCOUNTER — Other Ambulatory Visit: Payer: Self-pay

## 2019-12-03 ENCOUNTER — Ambulatory Visit (INDEPENDENT_AMBULATORY_CARE_PROVIDER_SITE_OTHER): Payer: Managed Care, Other (non HMO) | Admitting: Obstetrics and Gynecology

## 2019-12-03 ENCOUNTER — Other Ambulatory Visit (HOSPITAL_COMMUNITY)
Admission: RE | Admit: 2019-12-03 | Discharge: 2019-12-03 | Disposition: A | Discharge status: Home / Self Care | Payer: 59 | Source: Ambulatory Visit | Attending: Obstetrics and Gynecology | Admitting: Obstetrics and Gynecology

## 2019-12-03 ENCOUNTER — Encounter: Payer: Self-pay | Admitting: Obstetrics and Gynecology

## 2019-12-03 VITALS — BP 122/74 | Ht 62.0 in | Wt 137.0 lb

## 2019-12-03 DIAGNOSIS — Z124 Encounter for screening for malignant neoplasm of cervix: Secondary | ICD-10-CM

## 2019-12-03 DIAGNOSIS — Z1331 Encounter for screening for depression: Secondary | ICD-10-CM

## 2019-12-03 DIAGNOSIS — Z01419 Encounter for gynecological examination (general) (routine) without abnormal findings: Secondary | ICD-10-CM | POA: Diagnosis not present

## 2019-12-03 DIAGNOSIS — Z1329 Encounter for screening for other suspected endocrine disorder: Secondary | ICD-10-CM

## 2019-12-03 DIAGNOSIS — Z1339 Encounter for screening examination for other mental health and behavioral disorders: Secondary | ICD-10-CM

## 2019-12-03 NOTE — Progress Notes (Signed)
Gynecology Annual Exam  PCP: Marinda Elk, MD  Chief Complaint  Patient presents with  . Annual Exam    History of Present Illness:  [name] is a 43 y.o. [id number] who LMP was Patient's last menstrual period was 11/27/2019., presents today for her annual examination.  Her menses are regular every 28-30 days, lasting 3 day(s).  Dysmenorrhea moderate, occurring premenstrually. She does not have intermenstrual bleeding.  She is sexually active, contraception is tubal ligation.  Last Pap: 3 years  Results were: no abnormalities /neg HPV DNA negative Hx of STDs: HPV  There is no FH of breast cancer. There is no FH of ovarian cancer. The patient does not do self-breast exams.  Last mammogram: 12/2017, BiRads 1  Tobacco use: former smoker. Alcohol use: social drinker Exercise: is doing a home video for exercise (not consistent)  The patient wears seatbelts: yes.   The patient reports that domestic violence in her life is absent.   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 Abnormal Pap smear    s/p LEEP   . H/O abdominoplasty 2016  . S/P breast augmentation     Past Surgical History:  Procedure Laterality Date  . AUGMENTATION MAMMAPLASTY    . CERVICAL BIOPSY  W/ LOOP ELECTRODE EXCISION    . CERVICAL CONE BIOPSY    . ENDOMETRIAL ABLATION    . TUBAL LIGATION      Prior to Admission medications   Medication Sig Start Date End Date Taking? Authorizing Provider  phentermine (ADIPEX-P) 37.5 MG tablet  11/09/18   [provider]     Allergies  Allergen Reactions  . Sulfa Antibiotics   . Clindamycin/Lincomycin Rash   Obstetric History: [id number]  Social History   Socioeconomic History  . Marital status: Married    Spouse name: Not on file  . Number of children: Not on file  . Years of education: Not on file  . Highest education level: Not on file  Occupational History  . Not on file  Tobacco Use  . Smoking status: Former Research scientist (life sciences)  . Smokeless tobacco: Never Used  Vaping Use  . Vaping Use: Never used  Substance and Sexual Activity  . Alcohol use: No  . Drug use: No  . Sexual activity: Yes    Birth control/protection: Surgical    Comment: Tubal ligation   Other Topics Concern  . Not on file  Social History Narrative  . Not on file   Social Determinants of Health   Financial Resource Strain:   . Difficulty of Paying Living Expenses: Not on file  Food Insecurity:   . Worried About Charity fundraiser in the Last Year: Not on file  . Ran Out of Food in the Last Year: Not on file  Transportation Needs:   . Lack of Transportation (Medical): Not on file  . Lack of Transportation (Non-Medical): Not on file  Physical Activity:   . Days of Exercise per Week: Not on file  . Minutes of Exercise  per Session: Not on file  Stress:   . Feeling of Stress : Not on file  Social Connections:   . Frequency of Communication with Friends and Family: Not on file  . Frequency of Social Gatherings with Friends and Family: Not on file  . Attends Religious Services: Not on file  . Active Member of Clubs or Organizations: Not on file  . Attends Archivist Meetings: Not on file  . Marital Status: Not on file  Intimate Partner Violence:   . Fear of Current or Ex-Partner: Not on file  . Emotionally Abused: Not on file  . Physically Abused: Not on file  . Sexually Abused: Not on file    Family History  Problem Relation Age of Onset  . Hypertension Mother   . Heart disease Mother   . Prostate cancer  Father   . Stroke Father   . Breast cancer Paternal Grandmother 5  . Breast cancer Other        mat gr aunt x1  Pat gr aunt x2    Review of Systems  Constitutional: Negative.   HENT: Negative.   Eyes: Negative.   Respiratory: Negative.   Cardiovascular: Negative.   Gastrointestinal: Negative.   Genitourinary: Negative.   Musculoskeletal: Negative.   Skin: Negative.   Neurological: Negative.   Psychiatric/Behavioral: Negative.      Physical Exam BP 122/74   Ht 5\' 2"  (1.575 m)   Wt 137 lb (62.1 kg)   LMP 11/27/2019   BMI 25.06 kg/m    Physical Exam Constitutional:      General: She is not in acute distress.    Appearance: Normal appearance. She is well-developed.  Genitourinary:     Pelvic exam was performed with patient in the lithotomy position.     Vulva, urethra, bladder and uterus normal.     No inguinal adenopathy present in the right or left side.    No signs of injury in the vagina.     No vaginal discharge, erythema, tenderness or bleeding.     No cervical motion tenderness, discharge, lesion or polyp.     Uterus is mobile.     Uterus is not enlarged or tender.     No uterine mass detected.    Uterus is anteverted.     No right or left adnexal mass present.     Right adnexa not tender or full.     Left adnexa not tender or full.  HENT:     Head: Normocephalic and atraumatic.  Eyes:     General: No scleral icterus.    Conjunctiva/sclera: Conjunctivae normal.  Neck:     Thyroid: No thyromegaly (?enlargement).  Cardiovascular:     Rate and Rhythm: Normal rate and regular rhythm.     Heart sounds: No murmur heard.  No friction rub. No gallop.   Pulmonary:     Effort: Pulmonary effort is normal. No respiratory distress.     Breath sounds: Normal breath sounds. No wheezing or rales.  Chest:     Breasts:        Right: No inverted nipple, mass, nipple discharge, skin change or tenderness.        Left: No inverted nipple, mass, nipple discharge, skin  change or tenderness.  Abdominal:     General: Bowel sounds are normal. There is no distension.     Palpations: Abdomen is soft. There is no mass.     Tenderness: There is no abdominal tenderness. There is no  guarding or rebound.  Musculoskeletal:        General: No swelling or tenderness. Normal range of motion.     Cervical back: Normal range of motion and neck supple.  Lymphadenopathy:     Cervical: No cervical adenopathy.     Lower Body: No right inguinal adenopathy. No left inguinal adenopathy.  Neurological:     General: No focal deficit present.     Mental Status: She is alert and oriented to person, place, and time.     Cranial Nerves: No cranial nerve deficit.  Skin:    General: Skin is warm and dry.     Findings: No erythema or rash.  Psychiatric:        Mood and Affect: Mood normal.        Behavior: Behavior normal.        Judgment: Judgment normal.     Female chaperone present for pelvic and breast  portions of the physical exam  Results: AUDIT Questionnaire (screen for alcoholism): 1 PHQ-9: 1   Assessment: 44 y.o. G56P2012 female here for routine annual gynecologic examination  Plan: Problem List Items Addressed This Visit    None    Visit Diagnoses    Women's annual routine gynecological examination    -  Primary   Relevant Orders   TSH + free T4   Cytology - PAP   Screening for depression       Screening for alcoholism       Screening for thyroid disorder       Relevant Orders   TSH + free T4   Pap smear for cervical cancer screening       Relevant Orders   Cytology - PAP      Screening: -- Blood pressure screen normal  -- Mammogram: due. Patient understands that it is her responsibility to call to schedule -- Weight screening: normal -- Depression screening negative (PHQ-9) -- Nutrition: normal -- cholesterol screening: not due for screening -- osteoporosis screening: not due -- tobacco screening: not using -- alcohol screening: AUDIT  questionnaire indicates low-risk usage. -- family history of breast cancer screening: done. not at high risk. -- no evidence of domestic violence or intimate partner violence. -- STD screening: gonorrhea/chlamydia NAAT not collected per patient request. -- pap smear collected per ASCCP guidelines  Prentice Docker, MD 12/03/2019 2:19 PM

## 2019-12-04 LAB — TSH+FREE T4
Free T4: 0.98 ng/dL (ref 0.82–1.77)
TSH: 2.01 u[IU]/mL (ref 0.450–4.500)

## 2019-12-07 ENCOUNTER — Encounter: Payer: Self-pay | Admitting: Obstetrics and Gynecology

## 2019-12-07 ENCOUNTER — Ambulatory Visit: Payer: Managed Care, Other (non HMO) | Admitting: Obstetrics and Gynecology

## 2019-12-07 LAB — CYTOLOGY - PAP
Comment: NEGATIVE
Diagnosis: NEGATIVE
High risk HPV: NEGATIVE

## 2019-12-10 ENCOUNTER — Telehealth: Payer: Self-pay

## 2019-12-10 NOTE — Telephone Encounter (Signed)
Pt aware via vm 

## 2019-12-10 NOTE — Telephone Encounter (Signed)
-----   Message from Will Bonnet, MD sent at 12/06/2019 10:00 AM EDT ----- Would you mind calling Danielle Marshall and just letting her know her thyroid results were normal? thanks

## 2020-03-06 ENCOUNTER — Telehealth: Payer: Self-pay

## 2020-03-06 NOTE — Telephone Encounter (Signed)
The only thing that I can print out is her notes from the physical. She would need to call billing to get the codes.

## 2020-03-06 NOTE — Telephone Encounter (Signed)
Patient requesting a print out that has procedure codes for her annual exam that was done 12/03/19 by Dr. Glennon Mac [phone]

## 2020-03-07 NOTE — Telephone Encounter (Signed)
LMVM to notify patient to contact Billing at [phone] for this information. We are unable to print at our office.

## 2020-03-31 ENCOUNTER — Other Ambulatory Visit: Payer: Self-pay | Admitting: Neurology

## 2020-03-31 DIAGNOSIS — M545 Low back pain, unspecified: Secondary | ICD-10-CM

## 2020-05-08 ENCOUNTER — Other Ambulatory Visit: Payer: Self-pay | Admitting: Neurology

## 2020-05-08 ENCOUNTER — Other Ambulatory Visit: Payer: Self-pay | Admitting: Physical Medicine & Rehabilitation

## 2020-05-08 DIAGNOSIS — G8929 Other chronic pain: Secondary | ICD-10-CM

## 2020-05-08 DIAGNOSIS — M5442 Lumbago with sciatica, left side: Secondary | ICD-10-CM

## 2020-05-30 ENCOUNTER — Other Ambulatory Visit: Payer: Self-pay

## 2020-11-06 ENCOUNTER — Other Ambulatory Visit: Payer: Self-pay | Admitting: Physician Assistant

## 2020-11-06 DIAGNOSIS — Z1231 Encounter for screening mammogram for malignant neoplasm of breast: Secondary | ICD-10-CM

## 2020-12-16 ENCOUNTER — Ambulatory Visit (INDEPENDENT_AMBULATORY_CARE_PROVIDER_SITE_OTHER): Payer: Managed Care, Other (non HMO) | Admitting: Obstetrics and Gynecology

## 2020-12-16 ENCOUNTER — Other Ambulatory Visit: Payer: Self-pay

## 2020-12-16 ENCOUNTER — Encounter: Payer: Self-pay | Admitting: Obstetrics and Gynecology

## 2020-12-16 VITALS — BP 120/80 | HR 99 | Ht 62.0 in | Wt 137.0 lb

## 2020-12-16 DIAGNOSIS — Z1331 Encounter for screening for depression: Secondary | ICD-10-CM

## 2020-12-16 DIAGNOSIS — Z1339 Encounter for screening examination for other mental health and behavioral disorders: Secondary | ICD-10-CM | POA: Diagnosis not present

## 2020-12-16 DIAGNOSIS — Z01419 Encounter for gynecological examination (general) (routine) without abnormal findings: Secondary | ICD-10-CM

## 2020-12-16 NOTE — Progress Notes (Signed)
Gynecology Annual Exam  PCP: Marinda Elk, MD  Chief Complaint  Patient presents with   Annual Exam   History of Present Illness:  [name] is a 44 y.o. [id number] who LMP was Patient's last menstrual period was 12/14/2020., presents today for her annual examination.  Her menses are regular every 28-30 days, lasting 1-2 day(s).  Dysmenorrhea moderate, occurring premenstrually. She does not have intermenstrual bleeding.  She is sexually active, contraception is tubal ligation. She still has cramping after.  Last Pap: 1 years  Results were: no abnormalities /neg HPV DNA negative Hx of STDs: HPV  There is no FH of breast cancer. There is no FH of ovarian cancer. The patient does do self-breast exams.  Last mammogram: 12/2017, BiRads 1  Tobacco use:  former smoker. Alcohol use: social drinker Exercise: not much  The patient wears seatbelts: yes.   The patient reports that domestic violence in her life is absent.   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 - Pap --> NIL, HPV NEGATIVE, ECC NEG 6/15 - pap --> NIL (no endocervical component), HPV NEGATIVE 2/16 - pap --> NIL, HPV NEGATIVE 9/16 - pap --> ASCUS, HPV negative, ECC negative 05/2015 - pap --> NILM, HPV neg, ECC neg 01/2017 - pap --> NILM, HPV negative 11/2019 - pap  --> NILM, HPV negative  Past Medical History:  Diagnosis Date   Abnormal Pap smear    s/p LEEP    H/O abdominoplasty 2016   S/P breast augmentation     Past Surgical History:  Procedure Laterality Date   AUGMENTATION MAMMAPLASTY     CERVICAL BIOPSY  W/ LOOP ELECTRODE EXCISION     CERVICAL CONE BIOPSY     ENDOMETRIAL ABLATION     TUBAL LIGATION      Prior to Admission medications   Medication Sig Start Date End Date Taking? Authorizing Provider  phentermine (ADIPEX-P) 37.5 MG tablet  11/09/18   [provider]    Allergies  Allergen Reactions   Sulfa Antibiotics    Clindamycin/Lincomycin Rash   Obstetric History: [id number]  Social History   Socioeconomic History   Marital status: Married    Spouse name: Not on file   Number of children: Not on file   Years of education: Not on file   Highest education level: Not on file  Occupational History   Not on file  Tobacco Use   Smoking status: Former   Smokeless tobacco: Never  Vaping Use   Vaping Use: Never used  Substance and Sexual Activity   Alcohol use: No   Drug use: No   Sexual activity: Yes    Birth control/protection: Surgical    Comment: Tubal ligation   Other Topics Concern   Not on file  Social History Narrative   Not on file   Social Determinants of Health   Financial Resource Strain: Not on file  Food Insecurity: Not on file  Transportation Needs: Not on file  Physical Activity: Not on file  Stress: Not on file  Social Connections: Not on file  Intimate Partner Violence: Not on file    Family History  Problem Relation Age of Onset   Hypertension Mother    Heart disease Mother    Prostate cancer Father    Stroke Father    Breast cancer Paternal Grandmother 75  Breast cancer Other        mat gr aunt x1  Pat gr aunt x2    Review of Systems  Constitutional: Negative.   HENT: Negative.    Eyes: Negative.   Respiratory: Negative.    Cardiovascular: Negative.   Gastrointestinal: Negative.   Genitourinary: Negative.   Musculoskeletal: Negative.   Skin: Negative.   Neurological: Negative.   Psychiatric/Behavioral: Negative.      Physical Exam BP 120/80 (Cuff Size: Normal)   Pulse 99   Ht 5\' 2"  (1.575 m)   Wt 137 lb (62.1 kg)   LMP 12/14/2020 Comment: BTL  BMI 25.06 kg/m    Physical Exam Constitutional:      General: She is not in acute distress.    Appearance: Normal appearance. She is well-developed.  Genitourinary:     Vulva and bladder normal.     No vaginal discharge,  erythema, tenderness or bleeding.      Right Adnexa: not tender, not full and no mass present.    Left Adnexa: not tender, not full and no mass present.    No cervical motion tenderness, discharge, lesion or polyp.     Uterus is not enlarged or tender.     No uterine mass detected.    Pelvic exam was performed with patient in the lithotomy position.  Breasts:    Right: No inverted nipple, mass, nipple discharge, skin change or tenderness.     Left: No inverted nipple, mass, nipple discharge, skin change or tenderness.  HENT:     Head: Normocephalic and atraumatic.  Eyes:     General: No scleral icterus.    Conjunctiva/sclera: Conjunctivae normal.  Neck:     Thyroid: No thyromegaly.  Cardiovascular:     Rate and Rhythm: Normal rate and regular rhythm.     Heart sounds: No murmur heard.   No friction rub. No gallop.  Pulmonary:     Effort: Pulmonary effort is normal. No respiratory distress.     Breath sounds: Normal breath sounds. No wheezing or rales.  Abdominal:     General: Bowel sounds are normal. There is no distension.     Palpations: Abdomen is soft. There is no mass.     Tenderness: There is no abdominal tenderness. There is no guarding or rebound.  Musculoskeletal:        General: No swelling or tenderness. Normal range of motion.     Cervical back: Normal range of motion and neck supple.  Lymphadenopathy:     Cervical: No cervical adenopathy.     Lower Body: No right inguinal adenopathy. No left inguinal adenopathy.  Neurological:     General: No focal deficit present.     Mental Status: She is alert and oriented to person, place, and time.     Cranial Nerves: No cranial nerve deficit.  Skin:    General: Skin is warm and dry.     Findings: No erythema or rash.  Psychiatric:        Mood and Affect: Mood normal.        Behavior: Behavior normal.        Judgment: Judgment normal.    Female chaperone present for pelvic and breast  portions of the physical  exam  Results: AUDIT Questionnaire (screen for alcoholism): 2 PHQ-9: 1   Assessment: 45 y.o. [id number] female here for routine annual gynecologic examination  Plan: Problem List Items Addressed This Visit   None Visit Diagnoses     Women's annual  routine gynecological examination    -  Primary   Screening for depression       Screening for alcoholism          Screening: -- Blood pressure screen normal  -- Mammogram: due. Patient understands that it is her responsibility to call to schedule,. She is scheduled for 01/05/21.  -- Weight screening: normal -- Depression screening negative (PHQ-9) -- Nutrition: normal -- cholesterol screening: not due for screening -- osteoporosis screening: not due -- tobacco screening: not using -- alcohol screening: AUDIT questionnaire indicates low-risk usage. -- family history of breast cancer screening: done. not at high risk. -- no evidence of domestic violence or intimate partner violence. -- STD screening: gonorrhea/chlamydia NAAT not collected per patient request. -- pap smear collected per ASCCP guidelines  Prentice Docker, MD 12/16/2020 3:43 PM

## 2021-01-05 ENCOUNTER — Ambulatory Visit
Admission: RE | Admit: 2021-01-05 | Discharge: 2021-01-05 | Disposition: A | Discharge status: Home / Self Care | Payer: Managed Care, Other (non HMO) | Source: Ambulatory Visit | Attending: Physician Assistant | Admitting: Physician Assistant

## 2021-01-05 ENCOUNTER — Other Ambulatory Visit: Payer: Self-pay

## 2021-01-05 ENCOUNTER — Other Ambulatory Visit: Payer: Self-pay | Admitting: Physician Assistant

## 2021-01-05 DIAGNOSIS — Z1231 Encounter for screening mammogram for malignant neoplasm of breast: Secondary | ICD-10-CM | POA: Insufficient documentation

## 2021-01-13 ENCOUNTER — Other Ambulatory Visit: Payer: Self-pay | Admitting: Physician Assistant

## 2021-01-13 DIAGNOSIS — R928 Other abnormal and inconclusive findings on diagnostic imaging of breast: Secondary | ICD-10-CM

## 2021-01-13 DIAGNOSIS — N6489 Other specified disorders of breast: Secondary | ICD-10-CM

## 2021-01-16 ENCOUNTER — Ambulatory Visit
Admission: RE | Admit: 2021-01-16 | Discharge: 2021-01-16 | Disposition: A | Discharge status: Home / Self Care | Payer: Managed Care, Other (non HMO) | Source: Ambulatory Visit | Attending: Physician Assistant | Admitting: Physician Assistant

## 2021-01-16 ENCOUNTER — Other Ambulatory Visit: Payer: Self-pay

## 2021-01-16 DIAGNOSIS — R928 Other abnormal and inconclusive findings on diagnostic imaging of breast: Secondary | ICD-10-CM | POA: Insufficient documentation

## 2021-01-16 DIAGNOSIS — N6489 Other specified disorders of breast: Secondary | ICD-10-CM | POA: Diagnosis present

## 2022-01-06 ENCOUNTER — Other Ambulatory Visit: Payer: Self-pay | Admitting: Physician Assistant

## 2022-01-06 DIAGNOSIS — Z1231 Encounter for screening mammogram for malignant neoplasm of breast: Secondary | ICD-10-CM

## 2022-02-08 ENCOUNTER — Other Ambulatory Visit: Payer: Self-pay | Admitting: Physical Medicine & Rehabilitation

## 2022-02-08 DIAGNOSIS — M5442 Lumbago with sciatica, left side: Secondary | ICD-10-CM

## 2022-02-08 DIAGNOSIS — G8929 Other chronic pain: Secondary | ICD-10-CM

## 2022-02-15 ENCOUNTER — Ambulatory Visit
Admission: RE | Admit: 2022-02-15 | Discharge: 2022-02-15 | Disposition: A | Discharge status: Home / Self Care | Payer: Managed Care, Other (non HMO) | Source: Ambulatory Visit | Attending: Physical Medicine & Rehabilitation | Admitting: Physical Medicine & Rehabilitation

## 2022-02-15 DIAGNOSIS — M5442 Lumbago with sciatica, left side: Secondary | ICD-10-CM

## 2022-02-15 DIAGNOSIS — G8929 Other chronic pain: Secondary | ICD-10-CM

## 2022-03-18 IMAGING — MG MM DIGITAL DIAGNOSTIC UNILAT*R* W/ TOMO W/ CAD
4 series · 4 of 12 positions shown · non-contrast
Comparison: Previous exam(s).

CLINICAL DATA: Recall from screening to evaluate a possible right
breast mass.

EXAM:
DIGITAL DIAGNOSTIC UNILATERAL RIGHT MAMMOGRAM WITH TOMOSYNTHESIS AND
CAD; ULTRASOUND RIGHT BREAST LIMITED
TECHNIQUE: Right digital diagnostic mammography and breast tomosynthesis was
performed. The images were evaluated with computer-aided detection.;
Targeted ultrasound examination of the right breast was performed

[R CC synth-2D]
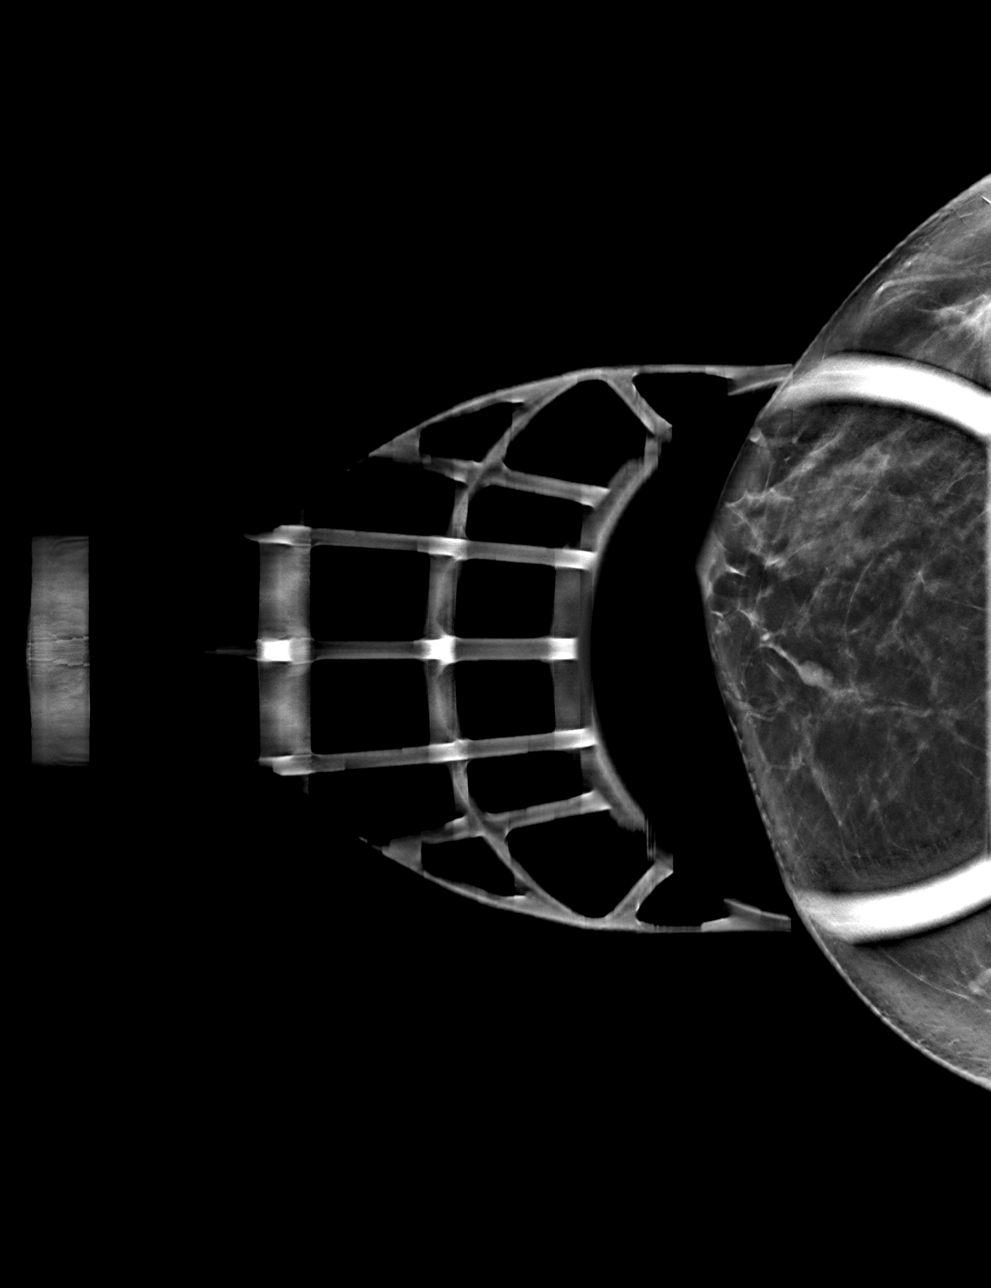

[R ML synth-2D]
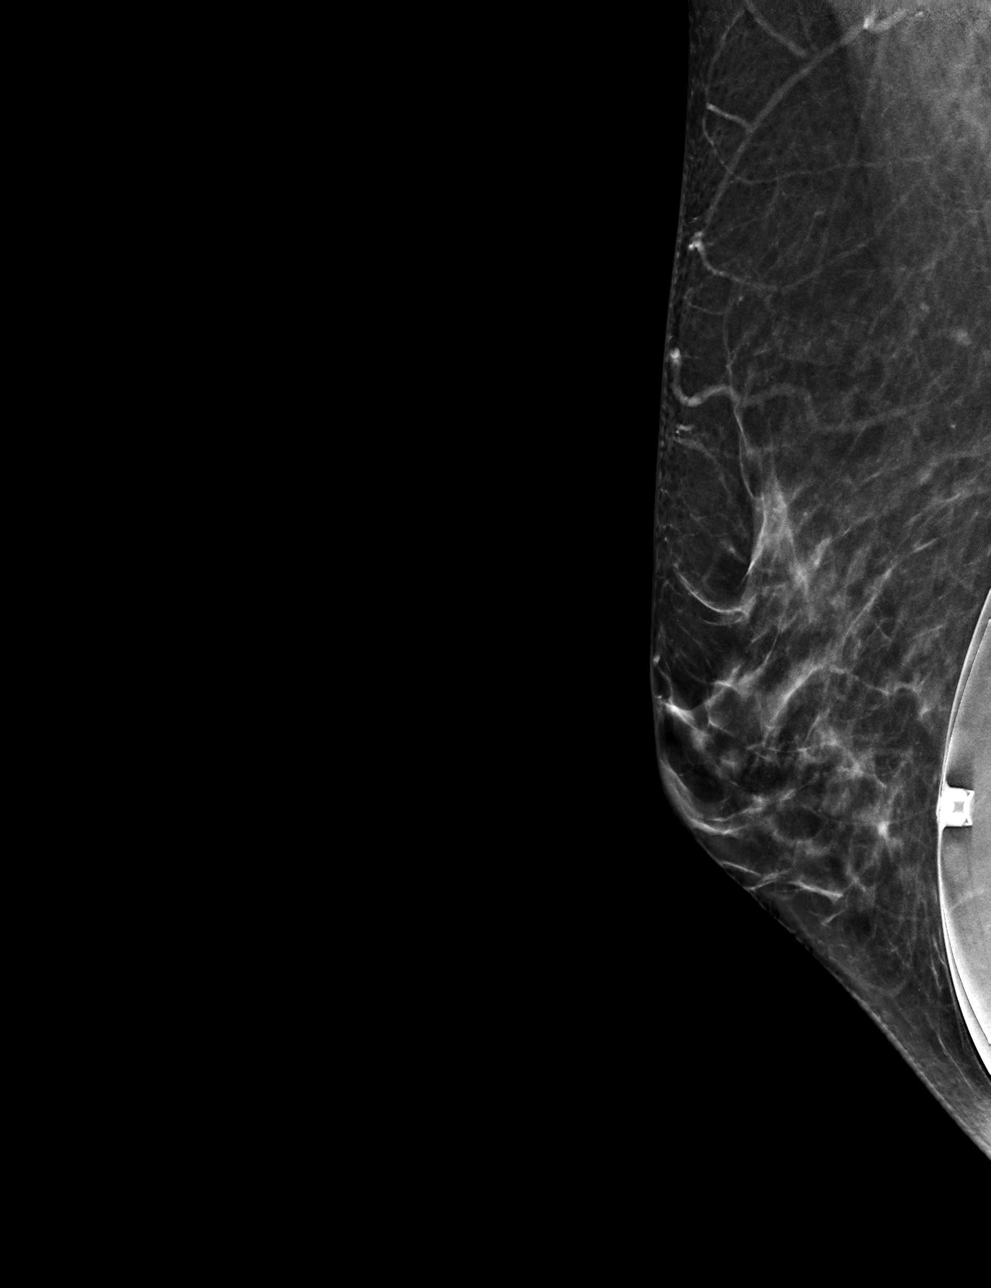

[R CC tomo · tomo slice 37/74.0]
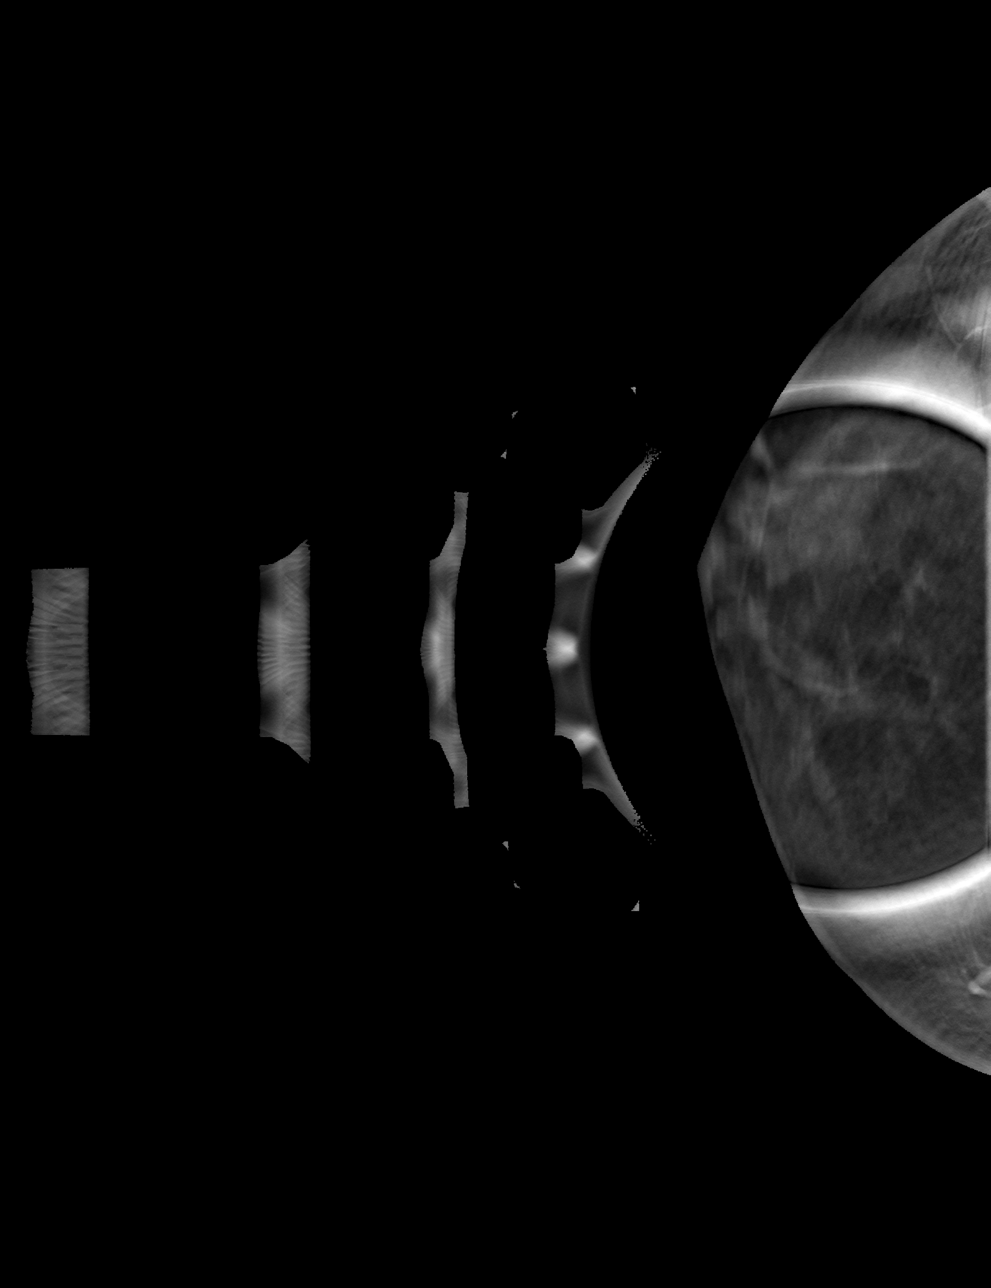

[R ML tomo · tomo slice 31/61.0]
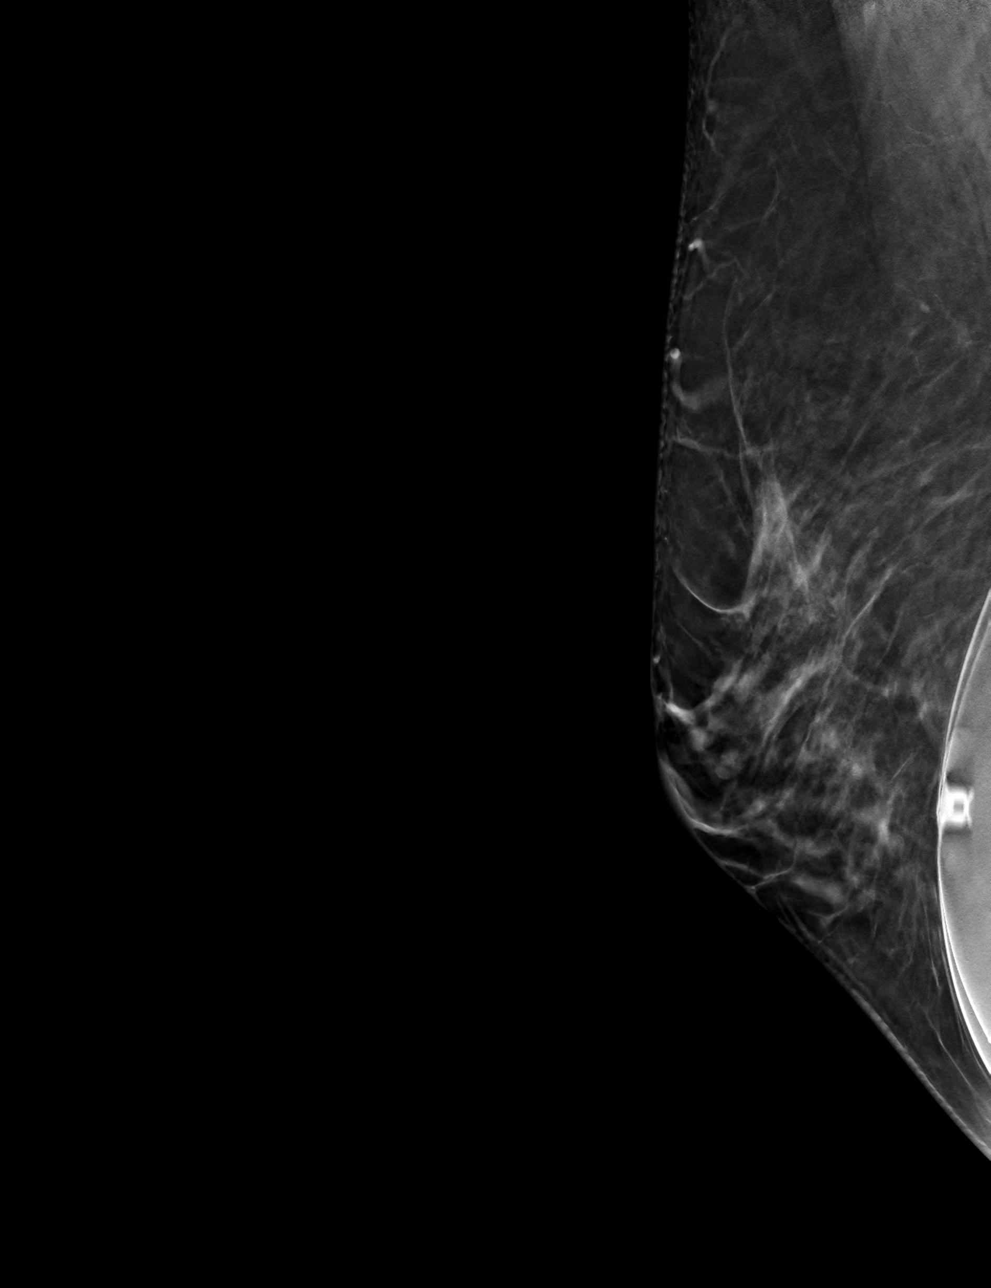

[4 of 12 positions shown; findings below may reference images not displayed]

ACR Breast Density Category c: The breast tissue is heterogeneously
dense, which may obscure small masses.
FINDINGS: Spot-compression tomographic images of the right breast were
obtained. There is an oval circumscribed 5 mm mass over the slightly
inner midportion of the right breast.

Targeted ultrasound is performed, showing a simple cyst over the
[DATE] position of the right breast 1 cm from the nipple measuring 4 x
4 x 5 mm correlating to the mammographic finding.
IMPRESSION: 5 mm simple cyst over the [DATE] position of the right breast.

RECOMMENDATION:
Recommend continued annual bilateral screening mammographic
follow-up.

I have discussed the findings and recommendations with the patient.
If applicable, a reminder letter will be sent to the patient
regarding the next appointment.

BI-RADS CATEGORY  2: Benign.

## 2022-03-18 IMAGING — US US BREAST*R* LIMITED INC AXILLA
1 series · 5 of 5 positions shown · non-contrast
Comparison: Previous exam(s).

CLINICAL DATA: Recall from screening to evaluate a possible right
breast mass.

EXAM:
DIGITAL DIAGNOSTIC UNILATERAL RIGHT MAMMOGRAM WITH TOMOSYNTHESIS AND
CAD; ULTRASOUND RIGHT BREAST LIMITED
TECHNIQUE: Right digital diagnostic mammography and breast tomosynthesis was
performed. The images were evaluated with computer-aided detection.;
Targeted ultrasound examination of the right breast was performed

[Series 1: us breast*right* limited inc axilla · 0.04mm/px · 5 of 5 slices shown]
[im 1/5]
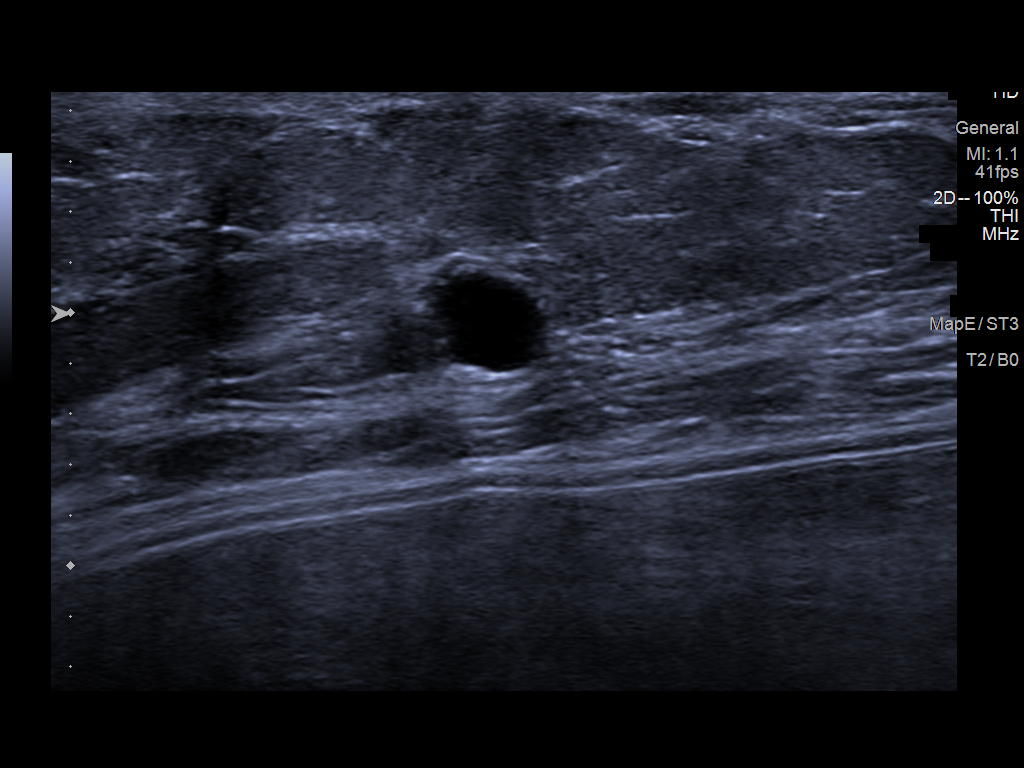
[im 2/5]
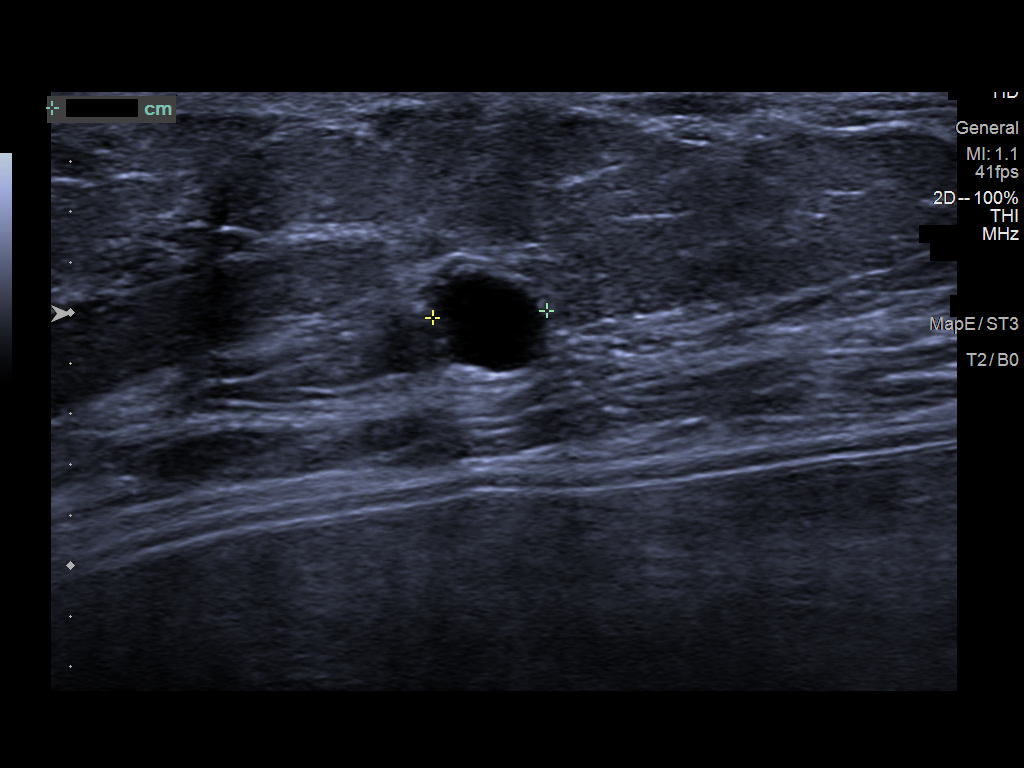
[im 3/5]
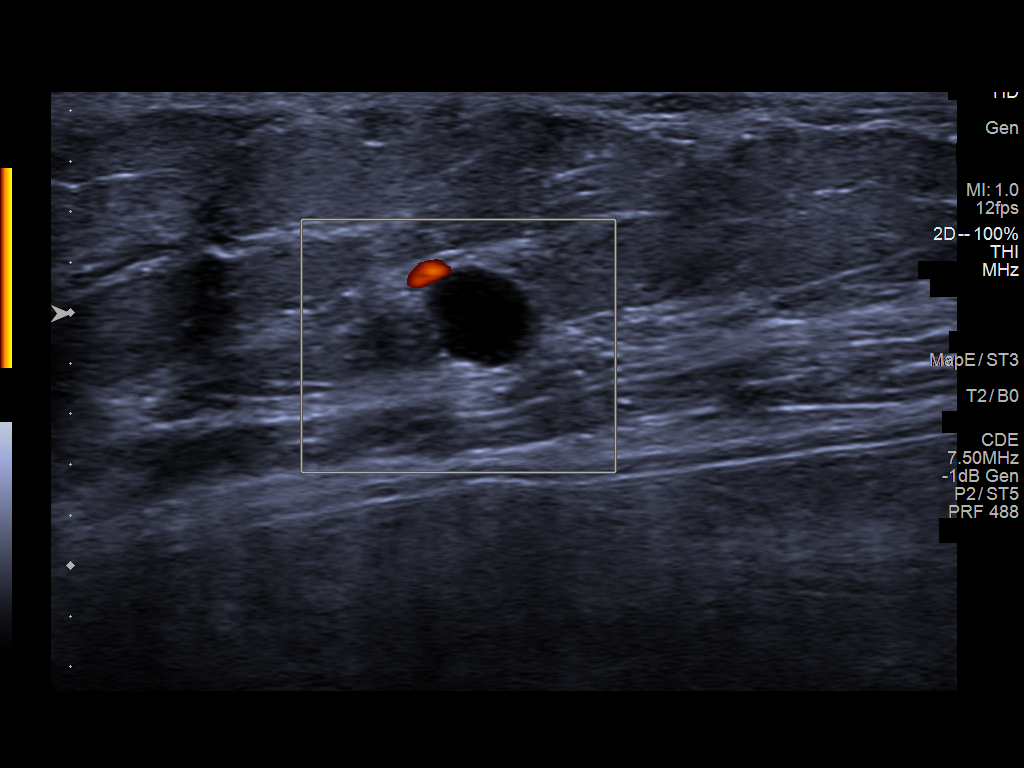
[im 4/5]
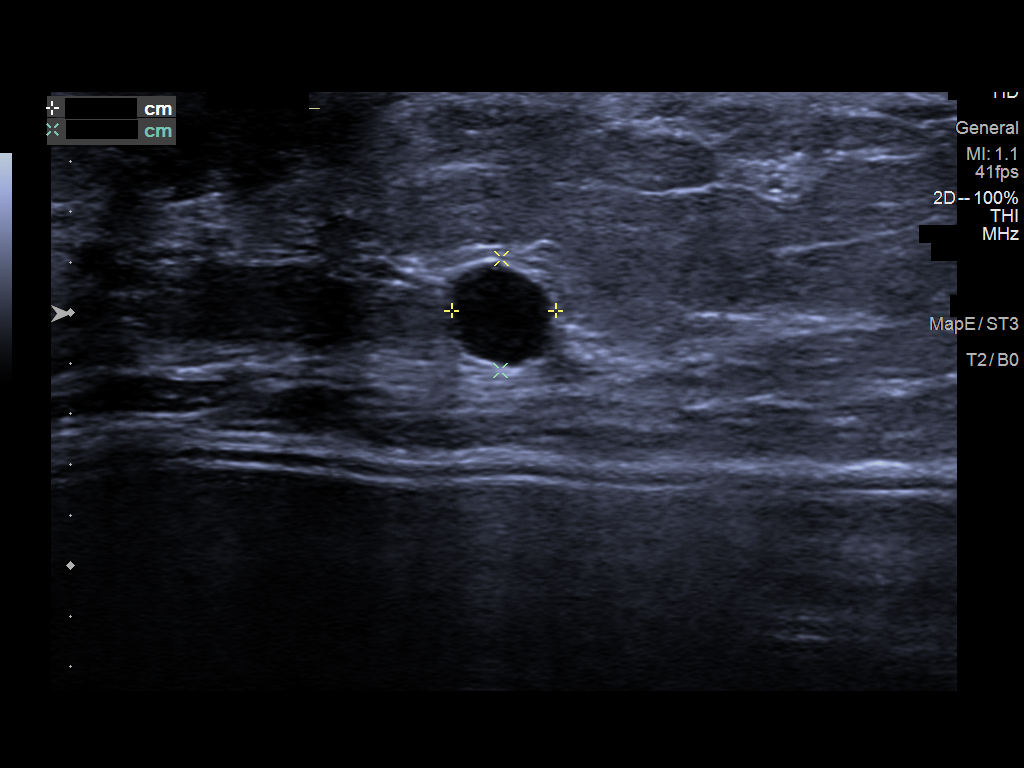
[im 5/5]
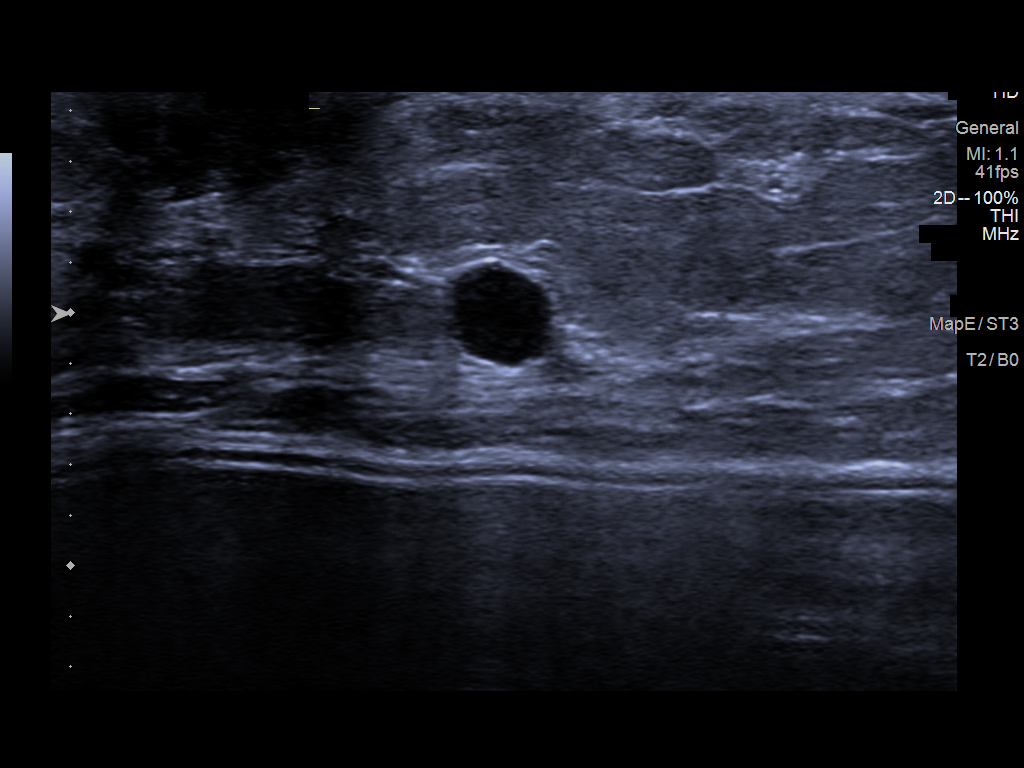

[5 of 5 positions shown; findings below may reference images not displayed]

ACR Breast Density Category c: The breast tissue is heterogeneously
dense, which may obscure small masses.
FINDINGS: Spot-compression tomographic images of the right breast were
obtained. There is an oval circumscribed 5 mm mass over the slightly
inner midportion of the right breast.

Targeted ultrasound is performed, showing a simple cyst over the
[DATE] position of the right breast 1 cm from the nipple measuring 4 x
4 x 5 mm correlating to the mammographic finding.
IMPRESSION: 5 mm simple cyst over the [DATE] position of the right breast.

RECOMMENDATION:
Recommend continued annual bilateral screening mammographic
follow-up.

I have discussed the findings and recommendations with the patient.
If applicable, a reminder letter will be sent to the patient
regarding the next appointment.

BI-RADS CATEGORY  2: Benign.

## 2022-12-08 ENCOUNTER — Other Ambulatory Visit: Payer: Self-pay | Admitting: Obstetrics and Gynecology

## 2022-12-08 DIAGNOSIS — Z1231 Encounter for screening mammogram for malignant neoplasm of breast: Secondary | ICD-10-CM
